# Patient Record
Sex: Male | Born: 1975 | Hispanic: No | Marital: Single | Smoking: Never smoker
Health system: Southern US, Community
[De-identification: ages and names within clinical notes are randomized; demographics above are authoritative.]

## PROBLEM LIST (undated history)

## (undated) HISTORY — PX: APPENDECTOMY: SHX54

---

## 2017-10-15 ENCOUNTER — Encounter: Payer: Self-pay | Admitting: Emergency Medicine

## 2017-10-15 ENCOUNTER — Emergency Department
Admission: EM | Admit: 2017-10-15 | Discharge: 2017-10-15 | Disposition: A | Discharge status: Home / Self Care | Payer: Self-pay | Attending: Emergency Medicine | Admitting: Emergency Medicine

## 2017-10-15 ENCOUNTER — Other Ambulatory Visit: Payer: Self-pay

## 2017-10-15 DIAGNOSIS — K2970 Gastritis, unspecified, without bleeding: Secondary | ICD-10-CM

## 2017-10-15 LAB — URINALYSIS, COMPLETE (UACMP) WITH MICROSCOPIC
BACTERIA UA: NONE SEEN
BILIRUBIN URINE: NEGATIVE
Glucose, UA: NEGATIVE mg/dL
Hgb urine dipstick: NEGATIVE
KETONES UR: NEGATIVE mg/dL
LEUKOCYTES UA: NEGATIVE
Nitrite: NEGATIVE
PROTEIN: NEGATIVE mg/dL
Specific Gravity, Urine: 1.017 (ref 1.005–1.030)
Squamous Epithelial / LPF: NONE SEEN (ref 0–5)
pH: 6 (ref 5.0–8.0)

## 2017-10-15 LAB — CBC
HEMATOCRIT: 42.6 % (ref 40.0–52.0)
Hemoglobin: 14.1 g/dL (ref 13.0–18.0)
MCH: 29.6 pg (ref 26.0–34.0)
MCHC: 33.1 g/dL (ref 32.0–36.0)
MCV: 89.4 fL (ref 80.0–100.0)
Platelets: 398 10*3/uL (ref 150–440)
RBC: 4.77 MIL/uL (ref 4.40–5.90)
RDW: 14.2 % (ref 11.5–14.5)
WBC: 8.4 10*3/uL (ref 3.8–10.6)

## 2017-10-15 LAB — COMPREHENSIVE METABOLIC PANEL
ALK PHOS: 61 U/L (ref 38–126)
ALT: 20 U/L (ref 17–63)
AST: 19 U/L (ref 15–41)
Albumin: 3.9 g/dL (ref 3.5–5.0)
Anion gap: 5 (ref 5–15)
BILIRUBIN TOTAL: 0.3 mg/dL (ref 0.3–1.2)
BUN: 12 mg/dL (ref 6–20)
CO2: 25 mmol/L (ref 22–32)
Calcium: 8.9 mg/dL (ref 8.9–10.3)
Chloride: 107 mmol/L (ref 101–111)
Creatinine, Ser: 1.21 mg/dL (ref 0.61–1.24)
GFR calc non Af Amer: >60 mL/min (ref >60)
Glucose, Bld: 110 mg/dL — ABNORMAL HIGH (ref 65–99)
POTASSIUM: 4.2 mmol/L (ref 3.5–5.1)
Sodium: 137 mmol/L (ref 135–145)
Total Protein: 6.2 g/dL — ABNORMAL LOW (ref 6.5–8.1)

## 2017-10-15 LAB — LIPASE, BLOOD: Lipase: 39 U/L (ref 11–51)

## 2017-10-15 MED ORDER — SUCRALFATE 1 G PO TABS: 1.0000 g | ORAL_TABLET | Freq: Four times a day (QID) | ORAL | 0 refills | Status: AC

## 2017-10-15 MED ORDER — LIDOCAINE VISCOUS 2 % MT SOLN: 20.0000 mL | OROMUCOSAL | 0 refills | Status: AC | PRN

## 2017-10-15 MED ORDER — GI COCKTAIL ~~LOC~~
30.0000 mL | Freq: Once | ORAL | Status: AC
  Administered 2017-10-15: 30 mL via ORAL
  Filled 2017-10-15: qty 30

## 2017-10-15 NOTE — ED Triage Notes (Signed)
Pt to ED via POV c/o epigastric abdominal pain, N/V/D since around 1000 yesterday morning. Pt states that he has vomited several times. Pt is in NAD at this time.

## 2017-10-15 NOTE — ED Provider Notes (Signed)
[facility] Emergency Department Provider Note    ____________________________________________   I have reviewed the triage vital signs and the nursing notes.   HISTORY  Chief Complaint Abdominal Pain   History limited by: Not Limited   HPI Timothy Fitzgerald is a 42 y.o. male who presents to the emergency department today because of concerns for abdominal pain.  Is located in the epigastric region.  It started 2 days ago.  It started suddenly.  It is severe.  He describes it as feeling like something is turning over in his stomach.  It is worse with exertion.  It is somewhat relieved with bending over.  The patient has had associated nausea and vomiting.  He denies any blood in his vomit.  Has had some associated diarrhea.  Denies any fevers.  States he has a history of acid reflux and takes zantac.   Per medical record review patient has a history of appendectomy  History reviewed. No pertinent past medical history.  There are no active problems to display for this patient.   Past Surgical History:  Procedure Laterality Date  . APPENDECTOMY      Prior to Admission medications   Not on File    Allergies Patient has no known allergies.  No family history on file.  Social History Social History   Tobacco Use  . Smoking status: Never Smoker  . Smokeless tobacco: Never Used  Substance Use Topics  . Alcohol use: Not Currently  . Drug use: Not Currently    Review of Systems Constitutional: No fever/chills Eyes: No visual changes. ENT: No sore throat. Cardiovascular: Denies chest pain. Respiratory: Denies shortness of breath. Gastrointestinal: Positive for epigastric pain, nausea and vomiting. Genitourinary: Negative for dysuria. Musculoskeletal: Negative for back pain. Skin: Negative for rash. Neurological: Negative for headaches, focal weakness or numbness.  ____________________________________________   PHYSICAL EXAM:  VITAL  SIGNS: ED Triage Vitals [10/15/17 1054]  Enc Vitals Group     BP 132/78     Pulse Rate 67     Resp 16     Temp 98.4 F (36.9 C)     Temp Source Oral     SpO2 99 %     Weight 180 lb (81.6 kg)     Height  (1.753 m)     Head Circumference      Peak Flow      Pain Score 8   Constitutional: Alert and oriented. Well appearing and in no distress. Eyes: Conjunctivae are normal.  ENT   Head: Normocephalic and atraumatic.   Nose: No congestion/rhinnorhea.   Mouth/Throat: Mucous membranes are moist.   Neck: No stridor. Hematological/Lymphatic/Immunilogical: No cervical lymphadenopathy. Cardiovascular: Normal rate, regular rhythm.  No murmurs, rubs, or gallops.  Respiratory: Normal respiratory effort without tachypnea nor retractions. Breath sounds are clear and equal bilaterally. No wheezes/rales/rhonchi. Gastrointestinal: Soft and tender to palpation in the epigastric and left upper quadrant. No rebound. No guarding.  Genitourinary: Deferred Musculoskeletal: Normal range of motion in all extremities. No lower extremity edema. Neurologic:  Normal speech and language. No gross focal neurologic deficits are appreciated.  Skin:  Skin is warm, dry and intact. No rash noted. Psychiatric: Mood and affect are normal. Speech and behavior are normal. Patient exhibits appropriate insight and judgment.  ____________________________________________    LABS (pertinent positives/negatives)  Lipase 39 CMP wnl except glu 110, pro 6.2, cbc wnl  ____________________________________________   EKG  I, Phineas Semen, attending physician, personally viewed and interpreted this EKG  EKG Time: 1105 Rate: 58 Rhythm: sinus bradycardia Axis: normal Intervals: qtc 388 QRS: narrow ST changes: no st elevation Impression: sinus bradycardia otherwise normal ekg   ____________________________________________     RADIOLOGY  None  ____________________________________________   PROCEDURES  Procedures  ____________________________________________   INITIAL IMPRESSION / ASSESSMENT AND PLAN / ED COURSE  Pertinent labs & imaging results that were available during my care of the patient were reviewed by me and considered in my medical decision making (see chart for details).  Patient presented to the emergency department today because of concerns for epigastric pain.  Differential would be broad including pancreatitis hepatitis gastritis esophagitis muscle spasm amongst other etiologies.  Blood work without concerning findings.  Patient did feel better after GI cocktail.  At this point I think gastritis unlikely.  Will give patient prescriptions and information about dietary changes.  Discussed findings and plan with patient.   ____________________________________________   FINAL CLINICAL IMPRESSION(S) / ED DIAGNOSES  Final diagnoses:  Gastritis, presence of bleeding unspecified, unspecified chronicity, unspecified gastritis type     Note: This dictation was prepared with Dragon dictation. Any transcriptional errors that result from this process are unintentional     Phineas Semen, MD 10/15/17 1534

## 2017-10-15 NOTE — ED Notes (Signed)
Awake and alert. NAD 

## 2017-10-15 NOTE — Discharge Instructions (Addendum)
Please seek medical attention for any high fevers, chest pain, shortness of breath, change in behavior, persistent vomiting, bloody stool or any other new or concerning symptoms.  

## 2017-10-30 ENCOUNTER — Other Ambulatory Visit: Payer: Self-pay

## 2017-10-30 ENCOUNTER — Emergency Department: Payer: Self-pay

## 2017-10-30 ENCOUNTER — Emergency Department
Admission: EM | Admit: 2017-10-30 | Discharge: 2017-10-30 | Disposition: A | Discharge status: Home / Self Care | Payer: Self-pay | Attending: Emergency Medicine | Admitting: Emergency Medicine

## 2017-10-30 ENCOUNTER — Encounter: Payer: Self-pay | Admitting: Emergency Medicine

## 2017-10-30 DIAGNOSIS — Y929 Unspecified place or not applicable: Secondary | ICD-10-CM | POA: Insufficient documentation

## 2017-10-30 DIAGNOSIS — S0083XA Contusion of other part of head, initial encounter: Secondary | ICD-10-CM | POA: Insufficient documentation

## 2017-10-30 DIAGNOSIS — Y9301 Activity, walking, marching and hiking: Secondary | ICD-10-CM | POA: Insufficient documentation

## 2017-10-30 DIAGNOSIS — Y999 Unspecified external cause status: Secondary | ICD-10-CM | POA: Insufficient documentation

## 2017-10-30 DIAGNOSIS — T148XXA Other injury of unspecified body region, initial encounter: Secondary | ICD-10-CM

## 2017-10-30 DIAGNOSIS — S4991XA Unspecified injury of right shoulder and upper arm, initial encounter: Secondary | ICD-10-CM | POA: Insufficient documentation

## 2017-10-30 MED ORDER — HYDROCODONE-ACETAMINOPHEN 5-325 MG PO TABS: 1.0000 | ORAL_TABLET | Freq: Four times a day (QID) | ORAL | 0 refills | Status: AC | PRN

## 2017-10-30 MED ORDER — HYDROMORPHONE HCL 1 MG/ML IJ SOLN
2.0000 mg | Freq: Once | INTRAMUSCULAR | Status: AC
  Administered 2017-10-30: 2 mg via INTRAMUSCULAR
  Filled 2017-10-30: qty 2

## 2017-10-30 MED ORDER — KETOROLAC TROMETHAMINE 30 MG/ML IJ SOLN
30.0000 mg | Freq: Once | INTRAMUSCULAR | Status: AC
  Administered 2017-10-30: 30 mg via INTRAMUSCULAR
  Filled 2017-10-30: qty 1

## 2017-10-30 MED ORDER — IBUPROFEN 600 MG PO TABS: 600.0000 mg | ORAL_TABLET | Freq: Three times a day (TID) | ORAL | 0 refills | Status: AC | PRN

## 2017-10-30 MED ORDER — ONDANSETRON 4 MG PO TBDP
4.0000 mg | ORAL_TABLET | Freq: Once | ORAL | Status: AC
  Administered 2017-10-30: 4 mg via ORAL
  Filled 2017-10-30: qty 1
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
## 2017-10-30 NOTE — ED Notes (Signed)
Pt did not sign discharge. He has his paperwork and understands instructions but he left once his ride arrived without signing

## 2017-10-30 NOTE — ED Notes (Signed)
Radiology calls to report pt is diaphoretic and vomiting; charge nurse notified and CT head ordered

## 2019-10-18 ENCOUNTER — Emergency Department (HOSPITAL_COMMUNITY)
Admission: EM | Admit: 2019-10-18 | Discharge: 2019-10-18 | Disposition: A | Discharge status: Home / Self Care | Payer: Self-pay | Attending: Emergency Medicine | Admitting: Emergency Medicine

## 2019-10-18 ENCOUNTER — Other Ambulatory Visit: Payer: Self-pay

## 2019-10-18 ENCOUNTER — Encounter (HOSPITAL_COMMUNITY): Payer: Self-pay

## 2019-10-18 DIAGNOSIS — R1011 Right upper quadrant pain: Secondary | ICD-10-CM | POA: Insufficient documentation

## 2019-10-18 DIAGNOSIS — R1110 Vomiting, unspecified: Secondary | ICD-10-CM | POA: Insufficient documentation

## 2019-10-18 DIAGNOSIS — Z5321 Procedure and treatment not carried out due to patient leaving prior to being seen by health care provider: Secondary | ICD-10-CM | POA: Insufficient documentation

## 2019-10-18 LAB — CBC
HCT: 48 % (ref 39.0–52.0)
Hemoglobin: 16 g/dL (ref 13.0–17.0)
MCH: 29 pg (ref 26.0–34.0)
MCHC: 33.3 g/dL (ref 30.0–36.0)
MCV: 87.1 fL (ref 80.0–100.0)
Platelets: 327 10*3/uL (ref 150–400)
RBC: 5.51 MIL/uL (ref 4.22–5.81)
RDW: 13.2 % (ref 11.5–15.5)
WBC: 11.6 10*3/uL — ABNORMAL HIGH (ref 4.0–10.5)
nRBC: 0 % (ref 0.0–0.2)

## 2019-10-18 LAB — URINALYSIS, ROUTINE W REFLEX MICROSCOPIC
Bilirubin Urine: NEGATIVE
Glucose, UA: NEGATIVE mg/dL
Hgb urine dipstick: NEGATIVE
Ketones, ur: NEGATIVE mg/dL
Leukocytes,Ua: NEGATIVE
Nitrite: NEGATIVE
Protein, ur: NEGATIVE mg/dL
Specific Gravity, Urine: 1.021 (ref 1.005–1.030)
pH: 5 (ref 5.0–8.0)

## 2019-10-18 LAB — COMPREHENSIVE METABOLIC PANEL
ALT: 17 U/L (ref 0–44)
AST: 17 U/L (ref 15–41)
Albumin: 5.3 g/dL — ABNORMAL HIGH (ref 3.5–5.0)
Alkaline Phosphatase: 70 U/L (ref 38–126)
Anion gap: 11 (ref 5–15)
BUN: 15 mg/dL (ref 6–20)
CO2: 20 mmol/L — ABNORMAL LOW (ref 22–32)
Calcium: 10.1 mg/dL (ref 8.9–10.3)
Chloride: 110 mmol/L (ref 98–111)
Creatinine, Ser: 1.11 mg/dL (ref 0.61–1.24)
GFR calc Af Amer: >60 mL/min (ref >60)
GFR calc non Af Amer: >60 mL/min (ref >60)
Glucose, Bld: 107 mg/dL — ABNORMAL HIGH (ref 70–99)
Potassium: 4.4 mmol/L (ref 3.5–5.1)
Sodium: 141 mmol/L (ref 135–145)
Total Bilirubin: 0.5 mg/dL (ref 0.3–1.2)
Total Protein: 8.1 g/dL (ref 6.5–8.1)

## 2019-10-18 LAB — LIPASE, BLOOD: Lipase: 27 U/L (ref 11–51)

## 2019-10-18 MED ORDER — SODIUM CHLORIDE 0.9% FLUSH: 3.0000 mL | Freq: Once | INTRAVENOUS | Status: DC

## 2019-10-18 NOTE — ED Notes (Signed)
Called from lobby x1 for room in ED w/ no response.

## 2019-10-18 NOTE — ED Notes (Signed)
Called a second time for room w/ no answer.

## 2019-10-18 NOTE — ED Triage Notes (Signed)
Per EMS- patient c/o RUQ abdominal pain. Patient has been referred to a GI physician,but has not made the appointment yet. Patient also c/o emesis.

## 2019-11-08 ENCOUNTER — Emergency Department (HOSPITAL_COMMUNITY)
Admission: EM | Admit: 2019-11-08 | Discharge: 2019-11-08 | Disposition: A | Discharge status: Home / Self Care | Payer: Self-pay | Attending: Emergency Medicine | Admitting: Emergency Medicine

## 2019-11-08 ENCOUNTER — Other Ambulatory Visit: Payer: Self-pay

## 2019-11-08 ENCOUNTER — Emergency Department (HOSPITAL_COMMUNITY): Payer: Self-pay

## 2019-11-08 DIAGNOSIS — R112 Nausea with vomiting, unspecified: Secondary | ICD-10-CM | POA: Insufficient documentation

## 2019-11-08 DIAGNOSIS — Z79899 Other long term (current) drug therapy: Secondary | ICD-10-CM | POA: Insufficient documentation

## 2019-11-08 DIAGNOSIS — F1290 Cannabis use, unspecified, uncomplicated: Secondary | ICD-10-CM | POA: Insufficient documentation

## 2019-11-08 DIAGNOSIS — R1012 Left upper quadrant pain: Secondary | ICD-10-CM

## 2019-11-08 LAB — COMPREHENSIVE METABOLIC PANEL
ALT: 21 U/L (ref 0–44)
AST: 21 U/L (ref 15–41)
Albumin: 5.1 g/dL — ABNORMAL HIGH (ref 3.5–5.0)
Alkaline Phosphatase: 75 U/L (ref 38–126)
Anion gap: 12 (ref 5–15)
BUN: 21 mg/dL — ABNORMAL HIGH (ref 6–20)
CO2: 24 mmol/L (ref 22–32)
Calcium: 9.8 mg/dL (ref 8.9–10.3)
Chloride: 107 mmol/L (ref 98–111)
Creatinine, Ser: 1.35 mg/dL — ABNORMAL HIGH (ref 0.61–1.24)
GFR calc Af Amer: >60 mL/min (ref >60)
GFR calc non Af Amer: >60 mL/min (ref >60)
Glucose, Bld: 189 mg/dL — ABNORMAL HIGH (ref 70–99)
Potassium: 3.3 mmol/L — ABNORMAL LOW (ref 3.5–5.1)
Sodium: 143 mmol/L (ref 135–145)
Total Bilirubin: 0.6 mg/dL (ref 0.3–1.2)
Total Protein: 8.4 g/dL — ABNORMAL HIGH (ref 6.5–8.1)

## 2019-11-08 LAB — CBC
HCT: 48 % (ref 39.0–52.0)
Hemoglobin: 15.7 g/dL (ref 13.0–17.0)
MCH: 28.3 pg (ref 26.0–34.0)
MCHC: 32.7 g/dL (ref 30.0–36.0)
MCV: 86.5 fL (ref 80.0–100.0)
Platelets: 509 10*3/uL — ABNORMAL HIGH (ref 150–400)
RBC: 5.55 MIL/uL (ref 4.22–5.81)
RDW: 13.3 % (ref 11.5–15.5)
WBC: 12.3 10*3/uL — ABNORMAL HIGH (ref 4.0–10.5)
nRBC: 0 % (ref 0.0–0.2)

## 2019-11-08 LAB — URINALYSIS, ROUTINE W REFLEX MICROSCOPIC
Bacteria, UA: NONE SEEN
Bilirubin Urine: NEGATIVE
Glucose, UA: NEGATIVE mg/dL
Hgb urine dipstick: NEGATIVE
Ketones, ur: 5 mg/dL — AB
Leukocytes,Ua: NEGATIVE
Nitrite: NEGATIVE
Protein, ur: 30 mg/dL — AB
Specific Gravity, Urine: 1.036 — ABNORMAL HIGH (ref 1.005–1.030)
pH: 5 (ref 5.0–8.0)

## 2019-11-08 LAB — LIPASE, BLOOD: Lipase: 22 U/L (ref 11–51)

## 2019-11-08 LAB — RAPID URINE DRUG SCREEN, HOSP PERFORMED
Amphetamines: NOT DETECTED
Barbiturates: NOT DETECTED
Benzodiazepines: NOT DETECTED
Cocaine: NOT DETECTED
Opiates: NOT DETECTED
Tetrahydrocannabinol: POSITIVE — AB

## 2019-11-08 MED ORDER — PANTOPRAZOLE SODIUM 20 MG PO TBEC: 20.0000 mg | DELAYED_RELEASE_TABLET | Freq: Every day | ORAL | 0 refills | Status: AC

## 2019-11-08 MED ORDER — ONDANSETRON 4 MG PO TBDP: 4.0000 mg | ORAL_TABLET | ORAL | 0 refills | Status: AC | PRN

## 2019-11-08 MED ORDER — HYDROMORPHONE HCL 1 MG/ML IJ SOLN
1.0000 mg | Freq: Once | INTRAMUSCULAR | Status: AC
  Administered 2019-11-08: 1 mg via INTRAVENOUS
  Filled 2019-11-08: qty 1

## 2019-11-08 MED ORDER — SODIUM CHLORIDE 0.9 % IV SOLN
1000.0000 mL | INTRAVENOUS | Status: DC
  Administered 2019-11-08: 1000 mL via INTRAVENOUS

## 2019-11-08 MED ORDER — HYOSCYAMINE SULFATE 0.125 MG SL SUBL: 0.1250 mg | SUBLINGUAL_TABLET | SUBLINGUAL | 0 refills | Status: AC | PRN

## 2019-11-08 MED ORDER — SODIUM CHLORIDE 0.9 % IV BOLUS (SEPSIS)
1000.0000 mL | Freq: Once | INTRAVENOUS | Status: AC
  Administered 2019-11-08: 1000 mL via INTRAVENOUS

## 2019-11-08 MED ORDER — SUCRALFATE 1 GM/10ML PO SUSP: 1.0000 g | Freq: Three times a day (TID) | ORAL | 0 refills | Status: AC

## 2019-11-08 MED ORDER — SODIUM CHLORIDE (PF) 0.9 % IJ SOLN
INTRAMUSCULAR | Status: AC
  Filled 2019-11-08: qty 50

## 2019-11-08 MED ORDER — CAPSAICIN 0.025 % EX CREA
TOPICAL_CREAM | Freq: Once | CUTANEOUS | Status: AC
  Filled 2019-11-08: qty 60

## 2019-11-08 MED ORDER — SODIUM CHLORIDE 0.9% FLUSH
3.0000 mL | Freq: Once | INTRAVENOUS | Status: AC
  Administered 2019-11-08: 3 mL via INTRAVENOUS

## 2019-11-08 MED ORDER — IOHEXOL 300 MG/ML  SOLN
100.0000 mL | Freq: Once | INTRAMUSCULAR | Status: AC | PRN
  Administered 2019-11-08: 80 mL via INTRAVENOUS

## 2019-11-08 MED ORDER — ONDANSETRON HCL 4 MG/2ML IJ SOLN
4.0000 mg | Freq: Once | INTRAMUSCULAR | Status: AC
  Administered 2019-11-08: 4 mg via INTRAVENOUS
  Filled 2019-11-08: qty 2

## 2019-11-08 MED ORDER — PANTOPRAZOLE SODIUM 40 MG IV SOLR
40.0000 mg | Freq: Once | INTRAVENOUS | Status: AC
  Administered 2019-11-08: 40 mg via INTRAVENOUS
  Filled 2019-11-08: qty 40

## 2019-11-08 MED ORDER — HALOPERIDOL LACTATE 5 MG/ML IJ SOLN
2.0000 mg | Freq: Once | INTRAMUSCULAR | Status: AC
  Administered 2019-11-08: 2 mg via INTRAVENOUS
  Filled 2019-11-08: qty 1

## 2019-11-08 MED ORDER — SODIUM CHLORIDE 0.9 % IV BOLUS
1000.0000 mL | Freq: Once | INTRAVENOUS | Status: AC
  Administered 2019-11-08: 1000 mL via INTRAVENOUS

## 2019-11-08 NOTE — ED Notes (Addendum)
Pt removed IV and stood at door "I am in pain" Phieffer MD made aware.    This RN controled bleeding and changed lines. Called EVS to mop floor.

## 2019-11-08 NOTE — ED Notes (Signed)
Messaged provider for pain management per pt request

## 2019-11-08 NOTE — ED Notes (Signed)
ED Provider at bedside. 

## 2019-11-08 NOTE — ED Notes (Signed)
Pt aware urine sample needed. Urinal at bedside.

## 2019-11-08 NOTE — ED Triage Notes (Addendum)
Transported by [facility] from home-- onset of abdominal pain that started this morning at 5 am. Generalized pain favoring left side plus n/v. EMS administered 100 mcg of Fentanyl and 4 mg of Zofran IV PTA. VSS and last CBG with EMS was 243. Patient has a history of GERD and reports daily marijuana use.

## 2019-11-08 NOTE — ED Notes (Signed)
Followed up with MD concerning pain management for pt per request.

## 2019-11-08 NOTE — ED Notes (Signed)
Pt ambulatory to RR 

## 2019-11-08 NOTE — ED Provider Notes (Addendum)
[facility]-EMERGENCY DEPT Provider Note   CSN: [id number] Arrival date & time: 11/08/19  8502     History Chief Complaint  Patient presents with  . Abdominal Pain    Timothy Fitzgerald is a 44 y.o. male.  HPI Patient reports for quite a while he is been getting intermittent episodes of severe epigastric pain and vomiting.  It may happen up to a couple times per month.  He reports that usually it resolves spontaneously within less than 24 hours.  These last episode he reports is lasted much longer.  He has had several days now of burning epigastric pain with vomiting.  He cannot determine if any food makes it worse.  He does get some relief by a hot bath.  No diarrhea.  No constipation.  No pain or burning with urination.  Patient has had prior appendectomy.  He is a routine marijuana smoker.  No other medical problems.  He is otherwise healthy.    No past medical history on file.  There are no problems to display for this patient.   Past Surgical History:  Procedure Laterality Date  . APPENDECTOMY         Family History  Family history unknown: Yes    Social History   Tobacco Use  . Smoking status: Never Smoker  . Smokeless tobacco: Never Used  Substance Use Topics  . Alcohol use: Not Currently    Comment: States quit 6 months  . 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 Authorizing Provider  OVER THE COUNTER MEDICATION Pt unable to remember otc acid reflex med   Yes [provider]  HYDROcodone-acetaminophen (NORCO) 5-325 MG tablet Take 1 tablet by mouth every 6 (six) hours as needed for up to 15 doses for severe pain. Patient not taking: Reported on 11/08/2019 10/30/17   Merrily Brittle, MD  ibuprofen (ADVIL,MOTRIN) 600 MG tablet Take 1 tablet (600 mg total) by mouth every 8 (eight) hours as needed. 10/30/17   Merrily Brittle, MD   lidocaine (XYLOCAINE) 2 % solution Use as directed 20 mLs in the mouth or throat as needed for mouth pain. Patient not taking: Reported on 10/30/2017 10/15/17   Phineas Semen, MD  sucralfate (CARAFATE) 1 g tablet Take 1 tablet (1 g total) by mouth 4 (four) times daily. Patient not taking: Reported on 10/30/2017 10/15/17   Phineas Semen, MD    Allergies    Patient has no known allergies.  Review of Systems   Review of Systems 10 systems reviewed and negative except as per HPI  physical Exam Updated Vital Signs BP (!) 147/110   Pulse 65   Temp 97.7 F (36.5 C) (Oral)   Resp 18   SpO2 100%   Physical Exam Constitutional:      Comments: Patient is alert and nontoxic.  No respiratory distress.  Well-nourished well-developed.  Appears uncomfortable.  HENT:     Head: Normocephalic and atraumatic.     Mouth/Throat:     Mouth: Mucous membranes are moist.     Pharynx: Oropharynx is clear.  Eyes:     Extraocular Movements: Extraocular movements intact.     Conjunctiva/sclera: Conjunctivae normal.  Cardiovascular:     Rate and Rhythm: Normal rate and regular rhythm.  Pulmonary:     Effort: Pulmonary effort is normal.     Breath sounds: Normal  breath sounds.  Abdominal:     Comments: Abdomen soft.  Patient endorses significant pain to palpation in the epigastrium and left upper quadrant.  No guarding.  Musculoskeletal:        General: No swelling or tenderness. Normal range of motion.     Cervical back: Neck supple.     Right lower leg: No edema.     Left lower leg: No edema.  Skin:    General: Skin is warm and dry.  Neurological:     General: No focal deficit present.     Mental Status: He is oriented to person, place, and time.     Coordination: Coordination normal.  Psychiatric:        Mood and Affect: Mood normal.     Comments: Patient is anxious and slightly tearful.     ED Results / Procedures / Treatments   Labs (all labs ordered are listed, but only abnormal  results are displayed) Labs Reviewed  COMPREHENSIVE METABOLIC PANEL - Abnormal; Notable for the following components:      Result Value   Potassium 3.3 (*)    Glucose, Bld 189 (*)    BUN 21 (*)    Creatinine, Ser 1.35 (*)    Total Protein 8.4 (*)    Albumin 5.1 (*)    All other components within normal limits  CBC - Abnormal; Notable for the following components:   WBC 12.3 (*)    Platelets 509 (*)    All other components within normal limits  URINALYSIS, ROUTINE W REFLEX MICROSCOPIC - Abnormal; Notable for the following components:   APPearance HAZY (*)    Specific Gravity, Urine 1.036 (*)    Ketones, ur 5 (*)    Protein, ur 30 (*)    All other components within normal limits  RAPID URINE DRUG SCREEN, HOSP PERFORMED - Abnormal; Notable for the following components:   Tetrahydrocannabinol POSITIVE (*)    All other components within normal limits  LIPASE, BLOOD    EKG None  Radiology No results found.  Procedures Procedures (including critical care time)  Medications Ordered in ED Medications  sodium chloride 0.9 % bolus 1,000 mL (0 mLs Intravenous Stopped 11/08/19 1315)    Followed by  sodium chloride 0.9 % bolus 1,000 mL (0 mLs Intravenous Stopped 11/08/19 1315)    Followed by  0.9 %  sodium chloride infusion (1,000 mLs Intravenous New Bag/Given 11/08/19 1426)  iohexol (OMNIPAQUE) 300 MG/ML solution 100 mL (has no administration in time range)  sodium chloride flush (NS) 0.9 % injection 3 mL (3 mLs Intravenous Given 11/08/19 0946)  pantoprazole (PROTONIX) injection 40 mg (40 mg Intravenous Given 11/08/19 1034)  haloperidol lactate (HALDOL) injection 2 mg (2 mg Intravenous Given 11/08/19 1034)  ondansetron (ZOFRAN) injection 4 mg (4 mg Intravenous Given 11/08/19 1034)  capsaicin (ZOSTRIX) 0.025 % cream ( Topical Given 11/08/19 1053)  HYDROmorphone (DILAUDID) injection 1 mg (1 mg Intravenous Given 11/08/19 1423)  ondansetron (ZOFRAN) injection 4 mg (4 mg Intravenous Given  11/08/19 1423)  sodium chloride 0.9 % bolus 1,000 mL (1,000 mLs Intravenous New Bag/Given 11/08/19 1426)    ED Course  I have reviewed the triage vital signs and the nursing notes.  Pertinent labs & imaging results that were available during my care of the patient were reviewed by me and considered in my medical decision making (see chart for details).    MDM Rules/Calculators/A&P  Patient initially rested comfortably after medications Zofran Haldol and Protonix he reports he awakened quite abruptly and had severe worsening pain in the abdomen.  He apparently was very agitated in the room and pulled out his IV.  At this time, since pain has rebounded and reportedly worse, we will proceed with CT abdomen pelvis.  Will administer Dilaudid 1 mg and continued fluid resuscitation.  Patient does have percent is in chronic marijuana use.  At this time, will obtain CT scan to rule out other etiology but if CT without acute findings, high suspicion for cannabinoid hyperemesis syndrome.  Patient has had recurrent episodes.  Will also need follow-up with GI to rule out other etiologies of persistent recurrent, sporadic vomiting and abdominal pain.  Oncoming physician to follow-up on CT report. Final Clinical Impression(s) / ED Diagnoses Final diagnoses:  Non-intractable vomiting with nausea, unspecified vomiting type  Left upper quadrant abdominal pain  Marijuana smoker, continuous    Rx / DC Orders ED Discharge Orders    None       Charlesetta Shanks, MD 11/08/19 [place], MD 11/08/19 1455

## 2019-11-08 NOTE — ED Notes (Signed)
Pt verbalizes understanding of DC instructions. Pt belongings returned and is ambulatory out of ED.  

## 2019-11-08 NOTE — Discharge Instructions (Signed)
1.  Schedule follow-up with [facility] gastroenterology as soon as possible.  You should also be seen by a family doctor.  If you do not have a family doctor, use the referral number in your discharge instructions to find 1. 2.  Start taking Protonix daily as prescribed.  Take Zofran if needed for nausea and vomiting. 3.  Discontinue all use of marijuana.  Marijuana, especially with long-term use, can lead to problems with recurrent vomiting and abdominal pain.  Hot baths can be helpful.  Also, over-the-counter capsaicin cream applied to the abdomen can be helpful.

## 2020-01-02 ENCOUNTER — Emergency Department (HOSPITAL_COMMUNITY)
Admission: EM | Admit: 2020-01-02 | Discharge: 2020-01-03 | Disposition: A | Discharge status: Home / Self Care | Payer: No Typology Code available for payment source | Attending: Emergency Medicine | Admitting: Emergency Medicine

## 2020-01-02 ENCOUNTER — Encounter (HOSPITAL_COMMUNITY): Payer: Self-pay

## 2020-01-02 ENCOUNTER — Emergency Department (HOSPITAL_COMMUNITY): Payer: No Typology Code available for payment source

## 2020-01-02 DIAGNOSIS — S01311A Laceration without foreign body of right ear, initial encounter: Secondary | ICD-10-CM | POA: Insufficient documentation

## 2020-01-02 DIAGNOSIS — Y929 Unspecified place or not applicable: Secondary | ICD-10-CM | POA: Insufficient documentation

## 2020-01-02 DIAGNOSIS — R202 Paresthesia of skin: Secondary | ICD-10-CM | POA: Insufficient documentation

## 2020-01-02 DIAGNOSIS — T1490XA Injury, unspecified, initial encounter: Secondary | ICD-10-CM

## 2020-01-02 DIAGNOSIS — Y939 Activity, unspecified: Secondary | ICD-10-CM | POA: Diagnosis not present

## 2020-01-02 DIAGNOSIS — Z23 Encounter for immunization: Secondary | ICD-10-CM | POA: Diagnosis not present

## 2020-01-02 DIAGNOSIS — Y999 Unspecified external cause status: Secondary | ICD-10-CM | POA: Insufficient documentation

## 2020-01-02 DIAGNOSIS — S0991XA Unspecified injury of ear, initial encounter: Secondary | ICD-10-CM | POA: Diagnosis present

## 2020-01-02 DIAGNOSIS — M549 Dorsalgia, unspecified: Secondary | ICD-10-CM | POA: Diagnosis not present

## 2020-01-02 DIAGNOSIS — S0181XA Laceration without foreign body of other part of head, initial encounter: Secondary | ICD-10-CM | POA: Insufficient documentation

## 2020-01-02 DIAGNOSIS — R1084 Generalized abdominal pain: Secondary | ICD-10-CM | POA: Insufficient documentation

## 2020-01-02 LAB — CBC
HCT: 38.8 % — ABNORMAL LOW (ref 39.0–52.0)
Hemoglobin: 12.4 g/dL — ABNORMAL LOW (ref 13.0–17.0)
MCH: 28.1 pg (ref 26.0–34.0)
MCHC: 32 g/dL (ref 30.0–36.0)
MCV: 88 fL (ref 80.0–100.0)
Platelets: 422 10*3/uL — ABNORMAL HIGH (ref 150–400)
RBC: 4.41 MIL/uL (ref 4.22–5.81)
RDW: 14.6 % (ref 11.5–15.5)
WBC: 10.9 10*3/uL — ABNORMAL HIGH (ref 4.0–10.5)
nRBC: 0 % (ref 0.0–0.2)

## 2020-01-02 LAB — SAMPLE TO BLOOD BANK

## 2020-01-02 MED ORDER — SODIUM CHLORIDE 0.9 % IV BOLUS
1000.0000 mL | Freq: Once | INTRAVENOUS | Status: AC
  Administered 2020-01-02: 1000 mL via INTRAVENOUS

## 2020-01-02 MED ORDER — FENTANYL CITRATE (PF) 100 MCG/2ML IJ SOLN
50.0000 ug | Freq: Once | INTRAMUSCULAR | Status: AC
  Administered 2020-01-02: 50 ug via INTRAVENOUS
  Filled 2020-01-02: qty 2

## 2020-01-02 MED ORDER — TETANUS-DIPHTH-ACELL PERTUSSIS 5-2.5-18.5 LF-MCG/0.5 IM SUSP
0.5000 mL | Freq: Once | INTRAMUSCULAR | Status: AC
  Administered 2020-01-02: 0.5 mL via INTRAMUSCULAR
  Filled 2020-01-02: qty 0.5

## 2020-01-02 MED ORDER — ONDANSETRON HCL 4 MG/2ML IJ SOLN
4.0000 mg | Freq: Once | INTRAMUSCULAR | Status: AC
  Administered 2020-01-02: 4 mg via INTRAVENOUS
  Filled 2020-01-02: qty 2
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
## 2020-01-03 ENCOUNTER — Emergency Department (HOSPITAL_COMMUNITY): Payer: No Typology Code available for payment source

## 2020-01-03 LAB — COMPREHENSIVE METABOLIC PANEL
ALT: 15 U/L (ref 0–44)
AST: 18 U/L (ref 15–41)
Albumin: 3.9 g/dL (ref 3.5–5.0)
Alkaline Phosphatase: 68 U/L (ref 38–126)
Anion gap: 10 (ref 5–15)
BUN: 16 mg/dL (ref 6–20)
CO2: 22 mmol/L (ref 22–32)
Calcium: 9.1 mg/dL (ref 8.9–10.3)
Chloride: 107 mmol/L (ref 98–111)
Creatinine, Ser: 1.47 mg/dL — ABNORMAL HIGH (ref 0.61–1.24)
GFR calc Af Amer: >60 mL/min (ref >60)
GFR calc non Af Amer: 57 mL/min — ABNORMAL LOW (ref >60)
Glucose, Bld: 90 mg/dL (ref 70–99)
Potassium: 4 mmol/L (ref 3.5–5.1)
Sodium: 139 mmol/L (ref 135–145)
Total Bilirubin: 0.6 mg/dL (ref 0.3–1.2)
Total Protein: 6.4 g/dL — ABNORMAL LOW (ref 6.5–8.1)

## 2020-01-03 LAB — I-STAT CHEM 8, ED
BUN: 18 mg/dL (ref 6–20)
Calcium, Ion: 1.14 mmol/L — ABNORMAL LOW (ref 1.15–1.40)
Chloride: 104 mmol/L (ref 98–111)
Creatinine, Ser: 1.6 mg/dL — ABNORMAL HIGH (ref 0.61–1.24)
Glucose, Bld: 84 mg/dL (ref 70–99)
HCT: 40 % (ref 39.0–52.0)
Hemoglobin: 13.6 g/dL (ref 13.0–17.0)
Potassium: 4 mmol/L (ref 3.5–5.1)
Sodium: 142 mmol/L (ref 135–145)
TCO2: 25 mmol/L (ref 22–32)

## 2020-01-03 LAB — URINALYSIS, ROUTINE W REFLEX MICROSCOPIC
Bilirubin Urine: NEGATIVE
Glucose, UA: NEGATIVE mg/dL
Hgb urine dipstick: NEGATIVE
Ketones, ur: NEGATIVE mg/dL
Leukocytes,Ua: NEGATIVE
Nitrite: NEGATIVE
Protein, ur: NEGATIVE mg/dL
Specific Gravity, Urine: 1.029 (ref 1.005–1.030)
pH: 8 (ref 5.0–8.0)

## 2020-01-03 LAB — LACTIC ACID, PLASMA: Lactic Acid, Venous: 1.5 mmol/L (ref 0.5–1.9)

## 2020-01-03 LAB — PROTIME-INR
INR: 1 (ref 0.8–1.2)
Prothrombin Time: 12.5 seconds (ref 11.4–15.2)

## 2020-01-03 LAB — ETHANOL: Alcohol, Ethyl (B): <10 mg/dL (ref <10)

## 2020-01-03 MED ORDER — CEPHALEXIN 250 MG PO CAPS
500.0000 mg | ORAL_CAPSULE | Freq: Once | ORAL | Status: AC
  Administered 2020-01-03: 500 mg via ORAL
  Filled 2020-01-03: qty 2

## 2020-01-03 MED ORDER — IOHEXOL 300 MG/ML  SOLN
100.0000 mL | Freq: Once | INTRAMUSCULAR | Status: AC | PRN
  Administered 2020-01-03: 100 mL via INTRAVENOUS

## 2020-01-03 MED ORDER — BACITRACIN ZINC 500 UNIT/GM EX OINT
TOPICAL_OINTMENT | Freq: Once | CUTANEOUS | Status: AC
  Filled 2020-01-03: qty 0.9

## 2020-01-03 MED ORDER — FENTANYL CITRATE (PF) 100 MCG/2ML IJ SOLN
50.0000 ug | Freq: Once | INTRAMUSCULAR | Status: AC
  Administered 2020-01-03: 50 ug via INTRAVENOUS
  Filled 2020-01-03: qty 2

## 2020-01-03 MED ORDER — LIDOCAINE-EPINEPHRINE 1 %-1:100000 IJ SOLN
10.0000 mL | Freq: Once | INTRAMUSCULAR | Status: AC
  Administered 2020-01-03: 10 mL via INTRADERMAL
  Filled 2020-01-03: qty 1

## 2020-01-03 MED ORDER — CEPHALEXIN 500 MG PO CAPS
500.0000 mg | ORAL_CAPSULE | Freq: Three times a day (TID) | ORAL | 0 refills | Status: AC
Start: 2020-01-03 — End: 2020-01-10
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
## 2020-01-03 NOTE — ED Notes (Signed)
Patient verbalizes understanding of discharge instructions. Opportunity for questioning and answers were provided. Armband removed by staff, pt discharged from ED. Pt. ambulatory and discharged with [employer].
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
